# Patient Record
Sex: Female | Born: 1955 | Race: White | Hispanic: No | Marital: Married | Smoking: Never smoker
Health system: Southern US, Community
[De-identification: ages and names within clinical notes are randomized; demographics above are authoritative.]

## PROBLEM LIST (undated history)

## (undated) DIAGNOSIS — J45909 Unspecified asthma, uncomplicated: Secondary | ICD-10-CM

## (undated) HISTORY — PX: KNEE SURGERY: SHX244

---

## 2006-12-07 ENCOUNTER — Ambulatory Visit: Payer: Self-pay | Admitting: Internal Medicine

## 2014-08-15 ENCOUNTER — Emergency Department (HOSPITAL_COMMUNITY): Payer: BLUE CROSS/BLUE SHIELD

## 2014-08-15 ENCOUNTER — Encounter (HOSPITAL_COMMUNITY): Payer: Self-pay

## 2014-08-15 ENCOUNTER — Emergency Department (HOSPITAL_COMMUNITY)
Admission: EM | Admit: 2014-08-15 | Discharge: 2014-08-15 | Disposition: A | Discharge status: Home / Self Care | Payer: BLUE CROSS/BLUE SHIELD | Attending: Emergency Medicine | Admitting: Emergency Medicine

## 2014-08-15 DIAGNOSIS — J45909 Unspecified asthma, uncomplicated: Secondary | ICD-10-CM | POA: Insufficient documentation

## 2014-08-15 DIAGNOSIS — S82142A Displaced bicondylar fracture of left tibia, initial encounter for closed fracture: Secondary | ICD-10-CM | POA: Diagnosis not present

## 2014-08-15 DIAGNOSIS — Y999 Unspecified external cause status: Secondary | ICD-10-CM | POA: Insufficient documentation

## 2014-08-15 DIAGNOSIS — Y92828 Other wilderness area as the place of occurrence of the external cause: Secondary | ICD-10-CM | POA: Insufficient documentation

## 2014-08-15 DIAGNOSIS — T148XXA Other injury of unspecified body region, initial encounter: Secondary | ICD-10-CM

## 2014-08-15 DIAGNOSIS — Z79899 Other long term (current) drug therapy: Secondary | ICD-10-CM | POA: Diagnosis not present

## 2014-08-15 DIAGNOSIS — Y9389 Activity, other specified: Secondary | ICD-10-CM | POA: Diagnosis not present

## 2014-08-15 DIAGNOSIS — S8992XA Unspecified injury of left lower leg, initial encounter: Secondary | ICD-10-CM | POA: Diagnosis present

## 2014-08-15 HISTORY — DX: Unspecified asthma, uncomplicated: J45.909

## 2014-08-15 MED ORDER — HYDROCODONE-ACETAMINOPHEN 5-325 MG PO TABS: 1.0000 | ORAL_TABLET | ORAL | Status: AC | PRN

## 2014-08-15 MED ORDER — DIAZEPAM 5 MG PO TABS: 5.0000 mg | ORAL_TABLET | Freq: Four times a day (QID) | ORAL | Status: AC | PRN

## 2014-08-15 MED ORDER — DIAZEPAM 5 MG PO TABS
5.0000 mg | ORAL_TABLET | Freq: Once | ORAL | Status: AC
  Administered 2014-08-15: 5 mg via ORAL
  Filled 2014-08-15: qty 1
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
## 2014-08-15 NOTE — Progress Notes (Signed)
EDCM spoke to patient at bedside.  Patient confirms she has [employer] insurance.  Patient's pcp is Dr. Orene DesanctisKaren Behling.  System updated.
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
## 2014-08-15 NOTE — Discharge Instructions (Signed)
Read the information below.  Use the prescribed medication as directed.  Please discuss all new medications with your pharmacist.  Do not take additional tylenol while taking the prescribed pain medication to avoid overdose.  You may return to the Emergency Department at any time for worsening condition or any new symptoms that concern you.   It is very important that you see an orthopedist within the next 48 hours for evaluation.  You can anticipate that this will likely require surgical intervention.    If you develop uncontrolled pain, weakness or numbness of the extremity, severe discoloration of the skin, or you are unable to move or feel your foot, return go to the nearest Emergency Department for a recheck.     Tibial Plateau Fracture, Displaced, Adult You have a fracture (break in bone) of your tibial plateau. This is a fracture in the upper part of the large "shin" bone (tibia) in your lower leg. The plateau is the joint surface that buts up against the femur (thigh bone of your upper leg). This is what makes up your knee joint. Displaced means that a portion of the fracture is out of place from where the bone is supposed to be. Because this fracture goes into the knee joint, it is necessary that this fracture be fixed in the best position possible. This fracture must be fixed surgically. This means an operation must be done to get the bones into the best possible position for healing. Otherwise, this fracture can cause severe arthritis and marked disability over the years. This is likely to occur even with the best treatment. These fractures are generally diagnosed with x-rays. Often a CT scan is needed to identify the fracture fragments and the degree of displacement. TREATMENT  Your fracture will be reduced (bones fragments are put back into position) and held in place with hardware (fixation devices). When your caregiver feels the fracture is healed well enough, you may begin range of motion  exercises to keep your knee limber (moving well). This may be very difficult at first. It is necessary to follow the instructions of all your caregivers, your surgeon, and physical therapist following surgery. LET YOUR CAREGIVER KNOW ABOUT:  Allergies  Medications taken including herbs, eye drops, over the counter medications, and creams  Use of steroids (by mouth or creams)  History of bleeding or blood problems  Previous problems with anesthetics or novocaine  Possibility of pregnancy, if this applies  History of blood clots (thrombophlebitis)  Previous surgery  Other health problems RISKS AND COMPLICATIONS All surgery is associated with risks. Some of these risks are:  Excessive bleeding.  Infection.  Failure to heal properly resulting in an unstable knee.  Stiffness of knee following repair.  Need to remove the hardware. BEFORE AND AFTER YOUR PROCEDURE Prior to surgery, an IV (intravenous line connected to your vein for giving fluids) may be started. You will also be given an anesthetic. These are medicines and gas to make you sleep. You may also be given regional anesthesia such as a spinal or epidural anesthetic. After surgery, you will be taken to the recovery area where a nurse will monitor your progress. You may have a catheter (a long, narrow, hollow tube) in your bladder following surgery that helps you pass your water. When you are awake, are stable, taking fluids well, and without complications, you will be returned to your room. You will receive physical therapy and other care until you are doing well and your caregiver feels  it is safe for you to be transferred either to home or to an extended care facility. HOME CARE INSTRUCTIONS   You may resume normal diet and activities as directed or allowed.  Keep ice packs (a bag of ice wrapped in a towel) on the surgical area for twenty minutes, four times per day, for the first two days following surgery. Use ice only if  OK with your surgeon or caregiver.  Change dressings if necessary or as directed.  If you have a plaster or fiberglass cast:  Do not try to scratch the skin under the cast using sharp or pointed objects.  Check the skin around the cast every day. You may put lotion on any red or sore areas.  Keep your cast dry and clean.  Do not put pressure on any part of your cast or splint until it is fully hardened.  Your cast or splint can be protected during bathing with a plastic bag. Do not lower the cast or splint into water.  Only take over-the-counter or prescription medicines for pain, discomfort, or fever as directed by your caregiver.  Use crutches as directed and do not exercise leg unless instructed.  Keep toes and ankles moving frequently if they are not immobilized in your splint or cast  Keep your leg elevated above your heart as much as possible during the first 24-48 hours after your surgery  These are not fractures to be taken lightly! If these bones become displaced and get out of position, it may eventually lead to arthritis and disability for the rest of your life. Problems often follow even the best of care. Follow the directions of your caregiver.  Keep appointments as directed. SEEK IMMEDIATE MEDICAL CARE IF:   There is redness, swelling, or increasing pain in the wound.  There is pus coming from wound.  An unexplained oral temperature above 102 F (38.9 C) develops.  You develop a bad smell coming from the wound or dressing.  There is a breaking open of the wound (edges not staying together) after sutures or staples have been removed.  You develop severe pain in the injured leg.  You begin to lose feeling in your foot or toes. Especially if someone else moving your toes becomes increasingly painful  You develop a cold or blue foot or toes on the injured side. If you do not have a window in your cast for observing the wound, a discharge or minor bleeding may  show up as a stain on the outside of your cast or caster splint. Report these findings to your caregiver. Document Released: 10/19/2001 Document Revised: 04/21/2011 Document Reviewed: 04/20/2007 [facility] Patient Information 2015 [place], [place]. This information is not intended to replace advice given to you by your health care provider. Make sure you discuss any questions you have with your health care provider.
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
